# Patient Record
Sex: Female | Born: 1965 | Race: Black or African American | Hispanic: No | Marital: Married | Smoking: Never smoker
Health system: Southern US, Community
[De-identification: ages and names within clinical notes are randomized; demographics above are authoritative.]

## PROBLEM LIST (undated history)

## (undated) DIAGNOSIS — I10 Essential (primary) hypertension: Secondary | ICD-10-CM

## (undated) HISTORY — PX: ABDOMINAL HYSTERECTOMY: SHX81

---

## 2008-04-22 HISTORY — PX: BREAST CYST ASPIRATION: SHX578

## 2015-10-30 DIAGNOSIS — M2010 Hallux valgus (acquired), unspecified foot: Secondary | ICD-10-CM | POA: Insufficient documentation

## 2016-08-31 ENCOUNTER — Ambulatory Visit
Admission: EM | Admit: 2016-08-31 | Discharge: 2016-08-31 | Disposition: A | Discharge status: Home / Self Care | Payer: BC Managed Care – PPO | Attending: Family Medicine | Admitting: Family Medicine

## 2016-08-31 DIAGNOSIS — M545 Low back pain, unspecified: Secondary | ICD-10-CM

## 2016-08-31 HISTORY — DX: Essential (primary) hypertension: I10

## 2016-08-31 MED ORDER — MELOXICAM 7.5 MG PO TABS: 7.5000 mg | ORAL_TABLET | Freq: Every day | ORAL | 0 refills | Status: DC

## 2016-08-31 MED ORDER — CYCLOBENZAPRINE HCL 10 MG PO TABS: 10.0000 mg | ORAL_TABLET | Freq: Two times a day (BID) | ORAL | 0 refills | Status: DC | PRN

## 2016-08-31 NOTE — ED Provider Notes (Signed)
[facility] ____________________________________________  Time seen: Approximately 8:44 AM  I have reviewed the triage vital signs and the nursing notes.   HISTORY  Chief Complaint Back Pain   HPI Sharon Escobar is a 51 y.o. female  presenting for evaluation of left lower back pain has been present for the last 2 days. 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 Patient states that she and 2 other people or assisting the patient to use the restroom and states this process she was having to help push and strain her low back. Patient reports she then had some low back pain after this. 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 Denies fall or direct injury. Patient states the pain is primarily with movements. Patient states standing or sitting still helps with pain. Reports she has been taking over-the-counter ibuprofen with minimal improvement. Denies pain radiation, paresthesias, decreased range of motion. Denies other alleviating factors or attempts. Denies chronic back issues. Denies any urinary or bowel retention or incontinence.  Denies chest pain, shortness of breath, abdominal pain, dysuria, extremity pain, extremity swelling or rash. Denies recent sickness. Denies recent antibiotic use.   No LMP recorded. Patient has had a hysterectomy.   Past Medical History:  Diagnosis Date  . Hypertension     There are no active problems to display for this patient.   Past Surgical History:  Procedure Laterality Date  . ABDOMINAL HYSTERECTOMY    . CESAREAN SECTION       No current facility-administered medications for this encounter.   Current Outpatient Prescriptions:  .  metoprolol tartrate (LOPRESSOR) 25 MG tablet, Take 25 mg by mouth 2 (two) times daily., Disp: , Rfl:  .  verapamil (CALAN) 120 MG tablet, Take 240 mg by mouth 3 (three) times daily., Disp: , Rfl:  .  cyclobenzaprine (FLEXERIL) 10 MG tablet, Take 1 tablet (10 mg total)  by mouth 2 (two) times daily as needed for muscle spasms. Do not drive as can cause drowsiness., Disp: 15 tablet, Rfl: 0 .  meloxicam (MOBIC) 7.5 MG tablet, Take 1 tablet (7.5 mg total) by mouth daily., Disp: 10 tablet, Rfl: 0  Allergies Hydralazine; Influenza vaccines; Lisinopril; and Norvasc [amlodipine besylate]  History reviewed. No pertinent family history.  Social History Social History  Substance Use Topics  . Smoking status: Never Smoker  . Smokeless tobacco: Never Used  . Alcohol use No    Review of Systems Constitutional: No fever/chills Cardiovascular: Denies chest pain. Respiratory: Denies shortness of breath. Gastrointestinal: No abdominal pain.  No nausea, no vomiting.  No diarrhea.  No constipation. Genitourinary: Negative for dysuria. Musculoskeletal: Positive for back pain. Skin: Negative for rash.   ____________________________________________   PHYSICAL EXAM:  VITAL SIGNS: ED Triage Vitals  Enc Vitals Group     BP 08/31/16 0826 (!) 164/102     Pulse Rate 08/31/16 0826 67     Resp 08/31/16 0826 18     Temp 08/31/16 0826 98 F (36.7 C)     Temp Source 08/31/16 0826 Oral     SpO2 08/31/16 0826 100 %     Weight 08/31/16 0827 156 lb (70.8 kg)     Height 08/31/16 0827 5\' 5"  (1.651 m)     Head Circumference --      Peak Flow --      Pain Score 08/31/16 0827 8     Pain Loc --      Pain Edu? --  Excl. in GC? --     Constitutional: Alert and oriented. Well appearing and in no acute distress. Cardiovascular: Normal rate, regular rhythm. Grossly normal heart sounds.  Good peripheral circulation. Respiratory: Normal respiratory effort without tachypnea nor retractions. Breath sounds are clear and equal bilaterally. No wheezes, rales, rhonchi. Gastrointestinal: Soft and nontender. No distention.  No CVA tenderness. Musculoskeletal:  Nontender with normal range of motion in all extremities. No midline cervical, thoracic or lumbar tenderness to palpation.  Bilateral pedal pulses equal and easily palpated.      Right lower leg:  No tenderness or edema.      Left lower leg:  No tenderness or edema.  Except: Left lower paralumbar minimal to mild tenderness to palpation, pain increases with lumbar flexion and extension, no pain with lumbar rotation, mild pain with over head and lateral stretching, bilateral straight leg test negative, no saddle anesthesia, changes positions quickly in room, steady gait, no pain with standing bilateral leg lifts, bilateral plantar flexion and dorsiflexion strong and equal..  Neurologic:  Normal speech and language. No gross focal neurologic deficits are appreciated. Speech is normal. No gait instability.  Skin:  Skin is warm, dry  Psychiatric: Mood and affect are normal. Speech and behavior are normal. Patient exhibits appropriate insight and judgment   ___________________________________________   LABS (all labs ordered are listed, but only abnormal results are displayed)  Labs Reviewed - No data to display   PROCEDURES Procedures   ________________________________________   INITIAL IMPRESSION / ASSESSMENT AND PLAN / ED COURSE  Pertinent labs & imaging results that were available during my care of the patient were reviewed by me and considered in my medical decision making (see chart for details).  Well-appearing patient. No acute distress. Denies direct trauma or fall. Discussed in detail with patient suspect strain injury.discussed with patient as no trauma or midline tenderness, will defer xray, patient agreed. Encouraged supportive care, rest, ice, stretching avoidance of strenuous activity. Denies Worker's Compensation injury. Will start patient on oral daily Mobic and when necessary Flexeril. Discussed indication, risks and benefits of medications with patient.  Discussed follow up with Primary care physician this week. Discussed follow up and return parameters including no resolution or any worsening  concerns. Patient verbalized understanding and agreed to plan.   ____________________________________________   FINAL CLINICAL IMPRESSION(S) / ED DIAGNOSES  Final diagnoses:  Acute left-sided low back pain without sciatica     Discharge Medication List as of 08/31/2016  8:45 AM    START taking these medications   Details  cyclobenzaprine (FLEXERIL) 10 MG tablet Take 1 tablet (10 mg total) by mouth 2 (two) times daily as needed for muscle spasms. Do not drive as can cause drowsiness., Starting Sat 08/31/2016, Normal    meloxicam (MOBIC) 7.5 MG tablet Take 1 tablet (7.5 mg total) by mouth daily., Starting Sat 08/31/2016, Normal        Note: This dictation was prepared with Dragon dictation along with smaller phrase technology. Any transcriptional errors that result from this process are unintentional.         Renford DillsMiller, Wofford Stratton, NP 08/31/16 1117

## 2016-08-31 NOTE — Discharge Instructions (Signed)
Take medication as prescribed. Rest. Drink plenty of fluids. Avoid strenuous activity. ° °Follow up with your primary care physician this week as needed. Return to Urgent care for new or worsening concerns.  ° °

## 2016-08-31 NOTE — ED Triage Notes (Signed)
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

## 2016-12-06 ENCOUNTER — Ambulatory Visit
Admission: EM | Admit: 2016-12-06 | Discharge: 2016-12-06 | Disposition: A | Discharge status: Home / Self Care | Payer: PRIVATE HEALTH INSURANCE | Attending: Family Medicine | Admitting: Family Medicine

## 2016-12-06 ENCOUNTER — Encounter: Payer: Self-pay | Admitting: *Deleted

## 2016-12-06 DIAGNOSIS — L02411 Cutaneous abscess of right axilla: Secondary | ICD-10-CM

## 2016-12-06 MED ORDER — MUPIROCIN 2 % EX OINT: 1.0000 "application " | TOPICAL_OINTMENT | Freq: Two times a day (BID) | CUTANEOUS | 0 refills | Status: DC

## 2016-12-06 MED ORDER — SULFAMETHOXAZOLE-TRIMETHOPRIM 800-160 MG PO TABS: 1.0000 | ORAL_TABLET | Freq: Two times a day (BID) | ORAL | 0 refills | Status: DC

## 2016-12-06 MED ORDER — HYDROCODONE-ACETAMINOPHEN 5-325 MG PO TABS: 1.0000 | ORAL_TABLET | Freq: Three times a day (TID) | ORAL | 0 refills | Status: DC | PRN

## 2016-12-06 NOTE — ED Triage Notes (Signed)
Right axillary abcess x1 week. Saw her PCP yesterday and an I&D was done but pt feels it wasn't effective.

## 2016-12-06 NOTE — ED Provider Notes (Signed)
[facility]    CSN: [id number] Arrival date & time: 12/06/16  1511     History   Chief Complaint Chief Complaint  Patient presents with  . Abscess    HPI Sharon Escobar is a 51 y.o. female.   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 She states about 6-7 days ago she started noticing abscess under her right arm. She tried several things to help did not get better. According to her she went [facility] was seen by the PA there. Several attempts were made to open abscess unsuccessfully and she states she has the lacerated marks under her right arm to show it. She was told that the problem going get worse and she'll need to be reevaluated. She was also placed on Keflex. Was not the right thing to do in the abscess was never actually opened up though she's has some drainage coming from the area. The abscess is gotten bigger swelling is gotten worse since she came here to be seen and evaluated and to get second opinion about the abscess. She states that she called the [place] urgent care they would not tell her the provider who is going to be there today.   The history is provided by the patient. No language interpreter was used.  Abscess  Location:  Shoulder/arm Shoulder/arm abscess location:  R axilla Abscess quality: draining, fluctuance, induration, painful, redness and warmth   Red streaking: no   Duration:  7 days Progression:  Worsening Pain details:    Quality:  Pressure and throbbing   Severity:  Severe   Timing:  Constant   Progression:  Worsening Chronicity:  New Relieved by:  Nothing Ineffective treatments: Attempts made yesterday to open abscess. Risk factors: no family hx of MRSA, no hx of MRSA and no prior abscess     Past Medical History:  Diagnosis Date  . Hypertension     There are no active problems to display for this patient.   Past Surgical History:  Procedure Laterality  Date  . ABDOMINAL HYSTERECTOMY    . CESAREAN SECTION      OB History    No data available       Home Medications    Prior to Admission medications   Medication Sig Start Date End Date Taking? Authorizing Provider  cyclobenzaprine (FLEXERIL) 10 MG tablet Take 1 tablet (10 mg total) by mouth 2 (two) times daily as needed for muscle spasms. Do not drive as can cause drowsiness. 08/31/16  Yes Renford Dills, NP  meloxicam (MOBIC) 7.5 MG tablet Take 1 tablet (7.5 mg total) by mouth daily. 08/31/16  Yes Renford Dills, NP  HYDROcodone-acetaminophen (NORCO) 5-325 MG tablet Take 1 tablet by mouth every 8 (eight) hours as needed for moderate pain or severe pain. 12/06/16   Hassan Rowan, MD  metoprolol tartrate (LOPRESSOR) 25 MG tablet Take 25 mg by mouth 2 (two) times daily.    [provider]  mupirocin ointment (BACTROBAN) 2 % Apply 1 application topically 2 (two) times daily. 12/06/16   Hassan Rowan, MD  sulfamethoxazole-trimethoprim (BACTRIM DS,SEPTRA DS) 800-160 MG tablet Take 1 tablet by mouth 2 (two) times daily. 12/06/16   Hassan Rowan, MD  verapamil (CALAN) 120 MG tablet Take 240 mg by mouth 3 (three) times daily.    [provider]    Family History History reviewed. No pertinent family history.  Social History Social History  Substance  Use Topics  . Smoking status: Never Smoker  . Smokeless tobacco: Never Used  . Alcohol use No     Allergies   Hydralazine; Influenza vaccines; Lisinopril; and Norvasc [amlodipine besylate]   Review of Systems Review of Systems  Musculoskeletal: Positive for myalgias.  Skin: Positive for wound.  All other systems reviewed and are negative.    Physical Exam Triage Vital Signs ED Triage Vitals  Enc Vitals Group     BP 12/06/16 1533 (!) 147/107     Pulse Rate 12/06/16 1533 75     Resp 12/06/16 1533 16     Temp 12/06/16 1533 97.9 F (36.6 C)     Temp Source 12/06/16 1533 Oral     SpO2 12/06/16 1533 100 %     Weight  12/06/16 1535 157 lb (71.2 kg)     Height 12/06/16 1535 5\' 4"  (1.626 m)     Head Circumference --      Peak Flow --      Pain Score 12/06/16 1536 8     Pain Loc --      Pain Edu? --      Excl. in GC? --    No data found.   Updated Vital Signs BP (!) 147/107 (BP Location: Left Arm)   Pulse 75   Temp 97.9 F (36.6 C) (Oral)   Resp 16   Ht 5\' 4"  (1.626 m)   Wt 157 lb (71.2 kg)   SpO2 100%   BMI 26.95 kg/m   Visual Acuity Right Eye Distance:   Left Eye Distance:   Bilateral Distance:    Right Eye Near:   Left Eye Near:    Bilateral Near:     Physical Exam  Constitutional: She is oriented to person, place, and time. She appears well-developed and well-nourished.  HENT:  Head: Normocephalic and atraumatic.  Right Ear: External ear normal.  Left Ear: External ear normal.  Eyes: Pupils are equal, round, and reactive to light. EOM are normal.  Neck: Normal range of motion. Neck supple.  Pulmonary/Chest: Effort normal.  Musculoskeletal: Normal range of motion.  Neurological: She is alert and oriented to person, place, and time.  Skin: Skin is warm. Rash noted. There is erythema.  Psychiatric: She has a normal mood and affect.  Vitals reviewed.    UC Treatments / Results  Labs (all labs ordered are listed, but only abnormal results are displayed) Labs Reviewed  AEROBIC/ANAEROBIC CULTURE (SURGICAL/DEEP WOUND)    EKG  EKG Interpretation None       Radiology No results found.  Procedures .Marland KitchenIncision and Drainage Date/Time: 12/06/2016 5:40 PM Performed by: Hassan Rowan Authorized by: Hassan Rowan   Consent:    Consent obtained:  Verbal   Consent given by:  Patient   Risks discussed:  Incomplete drainage and infection   Alternatives discussed:  No treatment Location:    Type:  Abscess   Location:  Upper extremity   Upper extremity location:  Arm   Arm location:  R upper arm Pre-procedure details:    Skin preparation:  Betadine Anesthesia (see MAR for  exact dosages):    Anesthesia method:  Local infiltration   Local anesthetic:  Lidocaine 1% w/o epi Procedure type:    Complexity:  Simple Procedure details:    Needle aspiration: no     Incision types:  Stab incision and single straight   Incision depth:  Dermal   Scalpel blade:  11   Wound management:  Probed and deloculated (  irrigated w/lidocaine)   Drainage:  Purulent   Drainage amount:  Scant   Wound treatment:  Wound left open and drain placed   Packing materials:  1/2 in gauze Post-procedure details:    Patient tolerance of procedure:  Tolerated with difficulty Comments:     Patient type procedure packing placed   (including critical care time)  Medications Ordered in UC Medications - No data to display   Initial Impression / Assessment and Plan / UC Course  I have reviewed the triage vital signs and the nursing notes.  Pertinent labs & imaging results that were available during my care of the patient were reviewed by me and considered in my medical decision making (see chart for details).   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 Recommend we stop the Keflex will place on Septra DS 1 tablet twice a day and Bactroban ointment twice a day. Will give her Norco 15 tablets 1 tablet 3 times a day.  Final Clinical Impressions(s) / UC Diagnoses   Final diagnoses:  Abscess of axilla, right    New Prescriptions Discharge Medication List as of 12/06/2016  4:43 PM    START taking these medications   Details  HYDROcodone-acetaminophen (NORCO) 5-325 MG tablet Take 1 tablet by mouth every 8 (eight) hours as needed for moderate pain or severe pain., Starting Fri 12/06/2016, Normal    mupirocin ointment (BACTROBAN) 2 % Apply 1 application topically 2 (two) times daily., Starting Fri 12/06/2016, Normal      sulfamethoxazole-trimethoprim (BACTRIM DS,SEPTRA DS) 800-160 MG tablet Take 1 tablet by mouth 2 (two) times daily., Starting Fri 12/06/2016, Normal       Note: This dictation was prepared with Dragon dictation along with smaller phrase technology. Any transcriptional errors that result from this process are unintentional. Controlled Substance Prescriptions [place] Controlled Substance Registry consulted?Patient was given tramadol yesterday looked at the drug reporting site and other than the tramadol was given yesterday she has not had any narcotics until the first of the year. Will give her 15 Norco tablets instructions not to take with the tramadol tablets       Hassan Rowan, MD 12/06/16 1746

## 2016-12-12 LAB — AEROBIC/ANAEROBIC CULTURE W GRAM STAIN (SURGICAL/DEEP WOUND): Special Requests: NORMAL

## 2016-12-12 LAB — AEROBIC/ANAEROBIC CULTURE (SURGICAL/DEEP WOUND)

## 2017-04-25 ENCOUNTER — Encounter: Payer: Self-pay | Admitting: *Deleted

## 2017-04-25 ENCOUNTER — Ambulatory Visit
Admission: EM | Admit: 2017-04-25 | Discharge: 2017-04-25 | Disposition: A | Discharge status: Home / Self Care | Payer: BC Managed Care – PPO | Attending: Family Medicine | Admitting: Family Medicine

## 2017-04-25 ENCOUNTER — Other Ambulatory Visit: Payer: Self-pay

## 2017-04-25 DIAGNOSIS — I10 Essential (primary) hypertension: Secondary | ICD-10-CM

## 2017-04-25 MED ORDER — CLONIDINE HCL 0.1 MG PO TABS: 0.1000 mg | ORAL_TABLET | Freq: Two times a day (BID) | ORAL | 0 refills | Status: DC

## 2017-04-25 NOTE — ED Triage Notes (Signed)
Patient started having high blood pressure reading 3 days ago. Patient has not taken blood pressure medications for 7 months. Recently patient started having headaches and started checking her BP.

## 2017-04-25 NOTE — ED Provider Notes (Signed)
[facility]    CSN: [id number] Arrival date & time: 04/25/17  1831     History   Chief Complaint Chief Complaint  Patient presents with  . Blood Pressure Check    HPI Sharon Escobar is a 52 y.o. female.   52 yo female with a h/o hypertension previously on antihypertensive medication presents with 3 days of elevated blood pressures. Denies any chest pain, shortness of breath, vision changes, numbness/tingling. States however that she had been experiencing headaches recently. Patient states she has an appointment with her PCP in 4 days.    The history is provided by the patient.    Past Medical History:  Diagnosis Date  . Hypertension     There are no active problems to display for this patient.   Past Surgical History:  Procedure Laterality Date  . ABDOMINAL HYSTERECTOMY    . CESAREAN SECTION      OB History    No data available       Home Medications    Prior to Admission medications   Medication Sig Start Date End Date Taking? Authorizing Provider  cyclobenzaprine (FLEXERIL) 10 MG tablet Take 1 tablet (10 mg total) by mouth 2 (two) times daily as needed for muscle spasms. Do not drive as can cause drowsiness. 08/31/16  Yes Renford Dills, NP  linaclotide [facility]) 290 MCG CAPS capsule Take 290 mcg by mouth daily before breakfast.   Yes [provider]  meloxicam (MOBIC) 7.5 MG tablet Take 1 tablet (7.5 mg total) by mouth daily. 08/31/16  Yes Renford Dills, NP  cloNIDine (CATAPRES) 0.1 MG tablet Take 1 tablet (0.1 mg total) by mouth 2 (two) times daily. 04/25/17   Payton Mccallum, MD  HYDROcodone-acetaminophen (NORCO) 5-325 MG tablet Take 1 tablet by mouth every 8 (eight) hours as needed for moderate pain or severe pain. 12/06/16   Hassan Rowan, MD  metoprolol tartrate (LOPRESSOR) 25 MG tablet Take 25 mg by mouth 2 (two) times daily.    [provider]  mupirocin ointment (BACTROBAN) 2 % Apply 1 application topically 2 (two) times daily.  12/06/16   Hassan Rowan, MD  sulfamethoxazole-trimethoprim (BACTRIM DS,SEPTRA DS) 800-160 MG tablet Take 1 tablet by mouth 2 (two) times daily. 12/06/16   Hassan Rowan, MD  verapamil (CALAN) 120 MG tablet Take 240 mg by mouth 3 (three) times daily.    [provider]    Family History History reviewed. No pertinent family history.  Social History Social History   Tobacco Use  . Smoking status: Never Smoker  . Smokeless tobacco: Never Used  Substance Use Topics  . Alcohol use: No  . Drug use: No     Allergies   Hctz [hydrochlorothiazide]; Hydralazine; Influenza vaccines; Lisinopril; and Norvasc [amlodipine besylate]   Review of Systems Review of Systems   Physical Exam Triage Vital Signs ED Triage Vitals  Enc Vitals Group     BP 04/25/17 1855 (!) 167/109     Pulse Rate 04/25/17 1855 82     Resp 04/25/17 1855 16     Temp 04/25/17 1855 98.4 F (36.9 C)     Temp Source 04/25/17 1855 Oral     SpO2 04/25/17 1855 98 %     Weight 04/25/17 1856 160 lb (72.6 kg)     Height 04/25/17 1856 5\' 4"  (1.626 m)     Head Circumference --      Peak Flow --      Pain Score 04/25/17 1856 3  Pain Loc --      Pain Edu? --      Excl. in GC? --    No data found.  Updated Vital Signs BP (!) 167/109 (BP Location: Left Arm)   Pulse 82   Temp 98.4 F (36.9 C) (Oral)   Resp 16   Ht 5\' 4"  (1.626 m)   Wt 160 lb (72.6 kg)   SpO2 98%   BMI 27.46 kg/m   Visual Acuity Right Eye Distance:   Left Eye Distance:   Bilateral Distance:    Right Eye Near:   Left Eye Near:    Bilateral Near:     Physical Exam  Constitutional: She appears well-developed and well-nourished. No distress.  Cardiovascular: Normal rate, regular rhythm and normal heart sounds.  Pulmonary/Chest: Effort normal and breath sounds normal. No stridor. No respiratory distress. She has no wheezes. She has no rales.  Musculoskeletal: She exhibits no edema.  Skin: She is not diaphoretic.  Nursing note and  vitals reviewed.    UC Treatments / Results  Labs (all labs ordered are listed, but only abnormal results are displayed) Labs Reviewed - No data to display  EKG  EKG Interpretation None       Radiology No results found.  Procedures Procedures (including critical care time)  Medications Ordered in UC Medications - No data to display   Initial Impression / Assessment and Plan / UC Course  I have reviewed the triage vital signs and the nursing notes.  Pertinent labs & imaging results that were available during my care of the patient were reviewed by me and considered in my medical decision making (see chart for details).       Final Clinical Impressions(s) / UC Diagnoses   Final diagnoses:  Essential hypertension    ED Discharge Orders        Ordered    cloNIDine (CATAPRES) 0.1 MG tablet  2 times daily     04/25/17 1934     1. diagnosis reviewed with patient 2. rx as per orders above; reviewed possible side effects, interactions, risks and benefits; for short term treatment until patient follows up with PCP next week 3. Follow-up prn if symptoms worsen or don't improve  Controlled Substance Prescriptions [place] Controlled Substance Registry consulted? Not Applicable   Payton Mccallumonty, Jhase Creppel, MD 04/25/17 2054

## 2017-05-07 ENCOUNTER — Other Ambulatory Visit: Payer: Self-pay | Admitting: Family Medicine

## 2017-05-07 DIAGNOSIS — Z1239 Encounter for other screening for malignant neoplasm of breast: Secondary | ICD-10-CM

## 2017-12-11 ENCOUNTER — Other Ambulatory Visit (HOSPITAL_COMMUNITY): Payer: Self-pay | Admitting: Orthopaedic Surgery

## 2017-12-11 ENCOUNTER — Other Ambulatory Visit: Payer: Self-pay | Admitting: Orthopaedic Surgery

## 2017-12-11 DIAGNOSIS — M545 Low back pain: Secondary | ICD-10-CM

## 2017-12-24 ENCOUNTER — Ambulatory Visit
Admission: RE | Admit: 2017-12-24 | Discharge: 2017-12-24 | Disposition: A | Discharge status: Home / Self Care | Payer: BC Managed Care – PPO | Source: Ambulatory Visit | Attending: Orthopaedic Surgery | Admitting: Orthopaedic Surgery

## 2017-12-24 ENCOUNTER — Ambulatory Visit
Admission: RE | Admit: 2017-12-24 | Discharge: 2017-12-24 | Disposition: A | Discharge status: Home / Self Care | Payer: BC Managed Care – PPO | Source: Ambulatory Visit | Attending: Family Medicine | Admitting: Family Medicine

## 2017-12-24 DIAGNOSIS — M545 Low back pain: Secondary | ICD-10-CM | POA: Insufficient documentation

## 2017-12-24 DIAGNOSIS — Z1239 Encounter for other screening for malignant neoplasm of breast: Secondary | ICD-10-CM

## 2017-12-24 DIAGNOSIS — Z1231 Encounter for screening mammogram for malignant neoplasm of breast: Secondary | ICD-10-CM | POA: Diagnosis not present

## 2017-12-24 DIAGNOSIS — M47816 Spondylosis without myelopathy or radiculopathy, lumbar region: Secondary | ICD-10-CM | POA: Diagnosis not present

## 2018-01-06 ENCOUNTER — Inpatient Hospital Stay
Admission: RE | Admit: 2018-01-06 | Discharge: 2018-01-06 | Disposition: A | Discharge status: Home / Self Care | Payer: Self-pay | Source: Ambulatory Visit | Attending: *Deleted | Admitting: *Deleted

## 2018-01-06 ENCOUNTER — Other Ambulatory Visit: Payer: Self-pay | Admitting: *Deleted

## 2018-01-06 DIAGNOSIS — Z9289 Personal history of other medical treatment: Secondary | ICD-10-CM

## 2018-10-16 ENCOUNTER — Other Ambulatory Visit: Payer: Self-pay | Admitting: Rehabilitation

## 2018-10-16 ENCOUNTER — Other Ambulatory Visit: Payer: Self-pay

## 2018-10-16 DIAGNOSIS — M5412 Radiculopathy, cervical region: Secondary | ICD-10-CM

## 2018-11-03 ENCOUNTER — Ambulatory Visit
Admission: RE | Admit: 2018-11-03 | Discharge: 2018-11-03 | Disposition: A | Discharge status: Home / Self Care | Payer: BC Managed Care – PPO | Source: Ambulatory Visit | Attending: Rehabilitation | Admitting: Rehabilitation

## 2018-11-03 ENCOUNTER — Other Ambulatory Visit: Payer: Self-pay

## 2018-11-03 DIAGNOSIS — M5412 Radiculopathy, cervical region: Secondary | ICD-10-CM | POA: Diagnosis not present

## 2019-01-16 ENCOUNTER — Other Ambulatory Visit: Payer: Self-pay

## 2019-01-16 ENCOUNTER — Emergency Department
Admission: EM | Admit: 2019-01-16 | Discharge: 2019-01-16 | Disposition: A | Discharge status: Home / Self Care | Payer: BC Managed Care – PPO | Attending: Emergency Medicine | Admitting: Emergency Medicine

## 2019-01-16 ENCOUNTER — Encounter: Payer: Self-pay | Admitting: Emergency Medicine

## 2019-01-16 DIAGNOSIS — R42 Dizziness and giddiness: Secondary | ICD-10-CM | POA: Diagnosis not present

## 2019-01-16 DIAGNOSIS — I10 Essential (primary) hypertension: Secondary | ICD-10-CM | POA: Insufficient documentation

## 2019-01-16 DIAGNOSIS — R531 Weakness: Secondary | ICD-10-CM

## 2019-01-16 LAB — COMPREHENSIVE METABOLIC PANEL
ALT: 14 U/L (ref 0–44)
AST: 22 U/L (ref 15–41)
Albumin: 5 g/dL (ref 3.5–5.0)
Alkaline Phosphatase: 53 U/L (ref 38–126)
Anion gap: 8 (ref 5–15)
BUN: 11 mg/dL (ref 6–20)
CO2: 27 mmol/L (ref 22–32)
Calcium: 10.2 mg/dL (ref 8.9–10.3)
Chloride: 107 mmol/L (ref 98–111)
Creatinine, Ser: 0.82 mg/dL (ref 0.44–1.00)
GFR calc Af Amer: >60 mL/min (ref >60)
GFR calc non Af Amer: >60 mL/min (ref >60)
Glucose, Bld: 98 mg/dL (ref 70–99)
Potassium: 3.3 mmol/L — ABNORMAL LOW (ref 3.5–5.1)
Sodium: 142 mmol/L (ref 135–145)
Total Bilirubin: 0.8 mg/dL (ref 0.3–1.2)
Total Protein: 8.4 g/dL — ABNORMAL HIGH (ref 6.5–8.1)

## 2019-01-16 LAB — URINALYSIS, COMPLETE (UACMP) WITH MICROSCOPIC
Bacteria, UA: NONE SEEN
Bilirubin Urine: NEGATIVE
Glucose, UA: NEGATIVE mg/dL
Hgb urine dipstick: NEGATIVE
Ketones, ur: NEGATIVE mg/dL
Leukocytes,Ua: NEGATIVE
Nitrite: NEGATIVE
Protein, ur: NEGATIVE mg/dL
Specific Gravity, Urine: 1.015 (ref 1.005–1.030)
pH: 5 (ref 5.0–8.0)

## 2019-01-16 LAB — CBC WITH DIFFERENTIAL/PLATELET
Abs Immature Granulocytes: 0.01 10*3/uL (ref 0.00–0.07)
Basophils Absolute: 0 10*3/uL (ref 0.0–0.1)
Basophils Relative: 1 %
Eosinophils Absolute: 0.1 10*3/uL (ref 0.0–0.5)
Eosinophils Relative: 2 %
HCT: 34.3 % — ABNORMAL LOW (ref 36.0–46.0)
Hemoglobin: 11.7 g/dL — ABNORMAL LOW (ref 12.0–15.0)
Immature Granulocytes: 0 %
Lymphocytes Relative: 36 %
Lymphs Abs: 1.4 10*3/uL (ref 0.7–4.0)
MCH: 31 pg (ref 26.0–34.0)
MCHC: 34.1 g/dL (ref 30.0–36.0)
MCV: 90.7 fL (ref 80.0–100.0)
Monocytes Absolute: 0.3 10*3/uL (ref 0.1–1.0)
Monocytes Relative: 9 %
Neutro Abs: 2 10*3/uL (ref 1.7–7.7)
Neutrophils Relative %: 52 %
Platelets: 212 10*3/uL (ref 150–400)
RBC: 3.78 MIL/uL — ABNORMAL LOW (ref 3.87–5.11)
RDW: 13 % (ref 11.5–15.5)
WBC: 3.9 10*3/uL — ABNORMAL LOW (ref 4.0–10.5)
nRBC: 0 % (ref 0.0–0.2)

## 2019-01-16 LAB — GLUCOSE, CAPILLARY: Glucose-Capillary: 89 mg/dL (ref 70–99)

## 2019-01-16 LAB — TROPONIN I (HIGH SENSITIVITY): Troponin I (High Sensitivity): <2 ng/L (ref <18)

## 2019-01-16 MED ORDER — SODIUM CHLORIDE 0.9 % IV SOLN
1000.0000 mL | Freq: Once | INTRAVENOUS | Status: AC
  Administered 2019-01-16: 16:00:00 1000 mL via INTRAVENOUS

## 2019-01-16 MED ORDER — KETOROLAC TROMETHAMINE 30 MG/ML IJ SOLN
30.0000 mg | Freq: Once | INTRAMUSCULAR | Status: AC
  Administered 2019-01-16: 30 mg via INTRAVENOUS
  Filled 2019-01-16: qty 1

## 2019-01-16 MED ORDER — PROMETHAZINE HCL 25 MG PO TABS
25.0000 mg | ORAL_TABLET | Freq: Once | ORAL | Status: AC
  Administered 2019-01-16: 17:00:00 25 mg via ORAL
  Filled 2019-01-16: qty 1

## 2019-01-16 MED ORDER — PROMETHAZINE HCL 25 MG PO TABS: 25.0000 mg | ORAL_TABLET | ORAL | 1 refills | Status: DC | PRN

## 2019-01-16 NOTE — ED Provider Notes (Signed)
[facility] Emergency Department Provider Note       Time seen: ----------------------------------------- 3:34 PM on 01/16/2019 -----------------------------------------   I have reviewed the triage vital signs and the nursing notes.  HISTORY   Chief Complaint Weakness and Dizziness   HPI Sharon Escobar is a 53 y.o. female with a history of hypertension, IBS who presents to the ED for weakness, dizziness and poor appetite.  Patient reports she has had the symptoms for the past 7 days, nothing makes it better.  She is only eaten crackers in the last week, she feels like someone is blowing air inside of her head.  She has had chills and diarrhea, denies other complaints.  Past Medical History:  Diagnosis Date  . Hypertension     There are no active problems to display for this patient.   Past Surgical History:  Procedure Laterality Date  . ABDOMINAL HYSTERECTOMY    . BREAST CYST ASPIRATION Left 2010   fna- neg  . CESAREAN SECTION      Allergies Hctz [hydrochlorothiazide], Hydralazine, Influenza vaccines, Lisinopril, and Norvasc [amlodipine besylate]  Social History Social History   Tobacco Use  . Smoking status: Never Smoker  . Smokeless tobacco: Never Used  Substance Use Topics  . Alcohol use: No  . Drug use: No   Review of Systems Constitutional: Negative for fever. Cardiovascular: Negative for chest pain. Respiratory: Negative for shortness of breath. Gastrointestinal: Negative for abdominal pain, positive for diarrhea Musculoskeletal: Negative for back pain. Skin: Negative for rash. Neurological: Positive for weakness, dizziness  All systems negative/normal/unremarkable except as stated in the HPI  ____________________________________________   PHYSICAL EXAM:  VITAL SIGNS: ED Triage Vitals  Enc Vitals Group     BP      Pulse      Resp      Temp      Temp src      SpO2      Weight      Height      Head Circumference       Peak Flow      Pain Score      Pain Loc      Pain Edu?      Excl. in [place]?    Constitutional: Alert and oriented. Well appearing and in no distress. Eyes: Conjunctivae are normal. Normal extraocular movements. ENT      Head: Normocephalic and atraumatic.      Nose: No congestion/rhinnorhea.      Mouth/Throat: Mucous membranes are moist.      Neck: No stridor. Cardiovascular: Normal rate, regular rhythm. No murmurs, rubs, or gallops. Respiratory: Normal respiratory effort without tachypnea nor retractions. Breath sounds are clear and equal bilaterally. No wheezes/rales/rhonchi. Gastrointestinal: Soft and nontender. Normal bowel sounds Musculoskeletal: Nontender with normal range of motion in extremities. No lower extremity tenderness nor edema. Neurologic:  Normal speech and language. No gross focal neurologic deficits are appreciated.  Skin:  Skin is warm, dry and intact. No rash noted. Psychiatric: Mood and affect are normal. Speech and behavior are normal.  ____________________________________________  EKG: Interpreted by me.  Sinus rhythm with a rate of 89 bpm, low voltage, normal axis, nonspecific T wave abnormalities, normal QT  ____________________________________________  ED COURSE:  As part of my medical decision making, I reviewed the following data within the [place] History obtained from family if available, nursing notes, old chart and ekg, as well as notes from prior ED visits. Patient presented for weakness and  dizziness, we will assess with labs and imaging as indicated at this time.   Procedures  Sharon Escobar was evaluated in Emergency Department on 01/16/2019 for the symptoms described in the history of present illness. She was evaluated in the context of the global COVID-19 pandemic, which necessitated consideration that the patient might be at risk for infection with the SARS-CoV-2 virus that causes COVID-19. Institutional protocols and algorithms  that pertain to the evaluation of patients at risk for COVID-19 are in a state of rapid change based on information released by regulatory bodies including the CDC and federal and state organizations. These policies and algorithms were followed during the patient's care in the ED.  ____________________________________________   LABS (pertinent positives/negatives)  Labs Reviewed  CBC WITH DIFFERENTIAL/PLATELET - Abnormal; Notable for the following components:      Result Value   WBC 3.9 (*)    RBC 3.78 (*)    Hemoglobin 11.7 (*)    HCT 34.3 (*)    All other components within normal limits  COMPREHENSIVE METABOLIC PANEL - Abnormal; Notable for the following components:   Potassium 3.3 (*)    Total Protein 8.4 (*)    All other components within normal limits  URINALYSIS, COMPLETE (UACMP) WITH MICROSCOPIC - Abnormal; Notable for the following components:   Color, Urine YELLOW (*)    APPearance CLEAR (*)    All other components within normal limits  GLUCOSE, CAPILLARY  CBG MONITORING, ED  TROPONIN I (HIGH SENSITIVITY)   ____________________________________________   DIFFERENTIAL DIAGNOSIS   Dehydration, electrolyte abnormality, medication side effect, depression  FINAL ASSESSMENT AND PLAN  Weakness, dizziness   Plan: The patient had presented for weakness and dizziness. Patient's labs did not reveal any acute process.  Her symptoms are likely multifactorial as she has recently taken herself off of multiple medications including Linzess, Percocet, baclofen, senna and perhaps others.  She was given fluids here and is cleared for outpatient follow-up.   Ulice Dash, MD    Note: This note was generated in part or whole with voice recognition software. Voice recognition is usually quite accurate but there are transcription errors that can and very often do occur. I apologize for any typographical errors that were not detected and corrected.     Emily Filbert,  MD 01/16/19 [phone]

## 2019-01-16 NOTE — ED Triage Notes (Signed)
Patient presents to the ED with weakness, dizziness, diarrhea, lack of appetite and chills x 1 week .  Patient denies any known covid19 exposure.  Patient is alert and oriented x 4.

## 2019-02-10 DIAGNOSIS — G8929 Other chronic pain: Secondary | ICD-10-CM | POA: Insufficient documentation

## 2019-02-10 DIAGNOSIS — M5412 Radiculopathy, cervical region: Secondary | ICD-10-CM | POA: Insufficient documentation

## 2019-02-10 DIAGNOSIS — D649 Anemia, unspecified: Secondary | ICD-10-CM | POA: Insufficient documentation

## 2019-02-17 DIAGNOSIS — R195 Other fecal abnormalities: Secondary | ICD-10-CM | POA: Insufficient documentation

## 2019-02-22 DIAGNOSIS — M4722 Other spondylosis with radiculopathy, cervical region: Secondary | ICD-10-CM | POA: Insufficient documentation

## 2019-06-22 ENCOUNTER — Other Ambulatory Visit: Payer: Self-pay | Admitting: Orthopaedic Surgery

## 2019-06-22 DIAGNOSIS — M5412 Radiculopathy, cervical region: Secondary | ICD-10-CM

## 2019-06-24 ENCOUNTER — Telehealth: Payer: Self-pay | Admitting: Nurse Practitioner

## 2019-06-24 NOTE — Telephone Encounter (Signed)
Phone call to patient to verify medication list and allergies for myelogram procedure. Pt aware she will not need to hold any medications for this procedure. Pt verbalized understanding. Pre and post procedure instructions reviewed with pt. °

## 2019-07-06 ENCOUNTER — Ambulatory Visit
Admission: RE | Admit: 2019-07-06 | Discharge: 2019-07-06 | Disposition: A | Discharge status: Home / Self Care | Payer: BC Managed Care – PPO | Source: Ambulatory Visit | Attending: Orthopaedic Surgery | Admitting: Orthopaedic Surgery

## 2019-07-06 DIAGNOSIS — M5412 Radiculopathy, cervical region: Secondary | ICD-10-CM

## 2019-07-06 MED ORDER — MEPERIDINE HCL 50 MG/ML IJ SOLN
50.0000 mg | Freq: Once | INTRAMUSCULAR | Status: AC
  Administered 2019-07-06: 50 mg via INTRAMUSCULAR

## 2019-07-06 MED ORDER — DIAZEPAM 5 MG PO TABS
10.0000 mg | ORAL_TABLET | Freq: Once | ORAL | Status: AC
  Administered 2019-07-06: 5 mg via ORAL

## 2019-07-06 MED ORDER — SODIUM CHLORIDE 0.9 % IV SOLN: Freq: Once | INTRAVENOUS | Status: AC

## 2019-07-06 MED ORDER — ONDANSETRON HCL 4 MG/2ML IJ SOLN
4.0000 mg | Freq: Once | INTRAMUSCULAR | Status: AC
  Administered 2019-07-06: 4 mg via INTRAMUSCULAR

## 2019-07-06 MED ORDER — DIAZEPAM 5 MG PO TABS
5.0000 mg | ORAL_TABLET | Freq: Once | ORAL | Status: AC
  Administered 2019-07-06: 5 mg via ORAL

## 2019-07-06 NOTE — Discharge Instructions (Signed)

## 2019-07-06 NOTE — Discharge Instr - Other Orders (Addendum)
1015: Pt reports cramping/ pain in bilateral legs from procedure. Dr. Charise Killian notified. See MAR for PRN medications given.   Pts cramping did not subside with Demerol and Zofran given per Dr. Charise Killian. Dr. Charise Killian was at bedside assessing pt. Pts pain 10/10 due to BLE cramping/muscle spasms. 5mg  of additional ativan was given. An IV was started, 500 ML of fluids given. Around 1115 cramping started to subside.

## 2019-07-07 ENCOUNTER — Other Ambulatory Visit: Payer: BC Managed Care – PPO

## 2020-01-05 ENCOUNTER — Ambulatory Visit (INDEPENDENT_AMBULATORY_CARE_PROVIDER_SITE_OTHER): Payer: BC Managed Care – PPO

## 2020-01-05 ENCOUNTER — Ambulatory Visit
Admission: EM | Admit: 2020-01-05 | Discharge: 2020-01-05 | Disposition: A | Discharge status: Home / Self Care | Payer: BC Managed Care – PPO | Attending: Physician Assistant | Admitting: Physician Assistant

## 2020-01-05 ENCOUNTER — Other Ambulatory Visit: Payer: Self-pay

## 2020-01-05 DIAGNOSIS — R52 Pain, unspecified: Secondary | ICD-10-CM

## 2020-01-05 DIAGNOSIS — M25552 Pain in left hip: Secondary | ICD-10-CM

## 2020-01-05 DIAGNOSIS — M545 Low back pain, unspecified: Secondary | ICD-10-CM

## 2020-01-05 DIAGNOSIS — G8929 Other chronic pain: Secondary | ICD-10-CM

## 2020-01-05 DIAGNOSIS — M5416 Radiculopathy, lumbar region: Secondary | ICD-10-CM

## 2020-01-05 NOTE — Discharge Instructions (Addendum)
Your hip x-ray was normal today.  You were buttocks pain is very likely due to your chronic back pain condition.  Continue following up with spine specialist for further work-up including MRI and further treatment.  Continue medications as prescribed by your spine specialist.  I have discussed several exercises with you today to help relieve the pain in the buttocks.  Try local massage as well as heat.  Try the piriformis stretch.  As discussed, you can get a lot of relief if you take a tennis ball or lacrosse ball and place it at the site of pain and then with your back against the wall roll out the muscle.  Consider physical therapy which can help her condition greatly.  Follow-up with [place] as needed.
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
## 2020-01-18 ENCOUNTER — Other Ambulatory Visit: Payer: Self-pay | Admitting: Orthopaedic Surgery

## 2020-01-18 DIAGNOSIS — M545 Low back pain, unspecified: Secondary | ICD-10-CM

## 2020-01-18 DIAGNOSIS — M47896 Other spondylosis, lumbar region: Secondary | ICD-10-CM

## 2020-02-04 ENCOUNTER — Ambulatory Visit: Admission: RE | Admit: 2020-02-04 | Payer: BC Managed Care – PPO | Source: Ambulatory Visit

## 2020-02-23 ENCOUNTER — Ambulatory Visit
Admission: RE | Admit: 2020-02-23 | Discharge: 2020-02-23 | Disposition: A | Discharge status: Home / Self Care | Payer: BC Managed Care – PPO | Source: Ambulatory Visit | Attending: Orthopaedic Surgery | Admitting: Orthopaedic Surgery

## 2020-02-23 ENCOUNTER — Other Ambulatory Visit: Payer: Self-pay

## 2020-02-23 DIAGNOSIS — M545 Low back pain, unspecified: Secondary | ICD-10-CM

## 2020-02-23 DIAGNOSIS — M47896 Other spondylosis, lumbar region: Secondary | ICD-10-CM | POA: Diagnosis present

## 2020-08-24 ENCOUNTER — Other Ambulatory Visit: Payer: Self-pay | Admitting: Orthopedic Surgery

## 2020-08-24 DIAGNOSIS — M47816 Spondylosis without myelopathy or radiculopathy, lumbar region: Secondary | ICD-10-CM

## 2020-09-10 ENCOUNTER — Other Ambulatory Visit: Payer: Self-pay

## 2020-09-10 ENCOUNTER — Ambulatory Visit
Admission: RE | Admit: 2020-09-10 | Discharge: 2020-09-10 | Disposition: A | Discharge status: Home / Self Care | Payer: BC Managed Care – PPO | Source: Ambulatory Visit | Attending: Orthopedic Surgery | Admitting: Orthopedic Surgery

## 2020-09-10 DIAGNOSIS — M47816 Spondylosis without myelopathy or radiculopathy, lumbar region: Secondary | ICD-10-CM

## 2020-12-11 ENCOUNTER — Other Ambulatory Visit: Payer: Self-pay

## 2020-12-11 ENCOUNTER — Ambulatory Visit
Admission: EM | Admit: 2020-12-11 | Discharge: 2020-12-11 | Disposition: A | Discharge status: Home / Self Care | Payer: BC Managed Care – PPO | Attending: Emergency Medicine | Admitting: Emergency Medicine

## 2020-12-11 DIAGNOSIS — L03032 Cellulitis of left toe: Secondary | ICD-10-CM | POA: Diagnosis not present

## 2020-12-11 MED ORDER — MUPIROCIN 2 % EX OINT: 1.0000 "application " | TOPICAL_OINTMENT | Freq: Two times a day (BID) | CUTANEOUS | 0 refills | Status: AC

## 2020-12-11 MED ORDER — CEPHALEXIN 500 MG PO CAPS: 1000.0000 mg | ORAL_CAPSULE | Freq: Two times a day (BID) | ORAL | 0 refills | Status: AC

## 2020-12-11 NOTE — ED Provider Notes (Signed)
HPI  SUBJECTIVE:  Sharon Escobar is a 55 y.o. female who presents with 3 days of erythema, pain, swelling, purulent drainage at the medial left second toe.  She got a pedicure 5 days ago, and thinks that they cut her nail too short.  No fevers, body aches, entire toe swelling.  She has been doing Hibiclens soaks and Neosporin without much improvement her symptoms.  Symptoms are worse with palpation, walking.  She has a past medical history of hypertension.  No history of MRSA, diabetes, neuropathy.  PMD: In [facility].   Past Medical History:  Diagnosis Date   Hypertension     Past Surgical History:  Procedure Laterality Date   ABDOMINAL HYSTERECTOMY     BREAST CYST ASPIRATION Left 2010   fna- neg   CESAREAN SECTION      Family History  Problem Relation Age of Onset   Breast cancer Mother 20    Social History   Tobacco Use   Smoking status: Never   Smokeless tobacco: Never  Vaping Use   Vaping Use: Never used  Substance Use Topics   Alcohol use: No   Drug use: No    No current facility-administered medications for this encounter.  Current Outpatient Medications:    cephALEXin (KEFLEX) 500 MG capsule, Take 2 capsules (1,000 mg total) by mouth 2 (two) times daily for 7 days., Disp: 28 capsule, Rfl: 0   ibuprofen (ADVIL) 600 MG tablet, Take by mouth., Disp: , Rfl:    linaclotide (LINZESS) 290 MCG CAPS capsule, Take 290 mcg by mouth daily before breakfast., Disp: , Rfl:    lubiprostone (AMITIZA) 24 MCG capsule, Take 24 mcg by mouth 2 (two) times daily with a meal., Disp: , Rfl:    mupirocin ointment (BACTROBAN) 2 %, Apply 1 application topically 2 (two) times daily., Disp: 22 g, Rfl: 0   pantoprazole (PROTONIX) 40 MG tablet, Take by mouth., Disp: , Rfl:    spironolactone (ALDACTONE) 25 MG tablet, Take 25 mg by mouth daily., Disp: , Rfl:    verapamil (CALAN) 120 MG tablet, Take 240 mg by mouth 3 (three) times daily., Disp: , Rfl:    escitalopram (LEXAPRO) 10 MG  tablet, Take 15 mg by mouth daily., Disp: , Rfl:    metoprolol tartrate (LOPRESSOR) 25 MG tablet, Take 25 mg by mouth 2 (two) times daily., Disp: , Rfl:   Allergies  Allergen Reactions   Hctz [Hydrochlorothiazide]     Pt states she isn't allergic to HCTZ   Hydralazine    Influenza Vaccines    Lisinopril    Norvasc [Amlodipine Besylate]      ROS  As noted in HPI.   Physical Exam  BP (!) 150/85 (BP Location: Left Arm)   Pulse 70   Temp 98.2 F (36.8 C) (Oral)   Resp 16   Ht 5\' 4"  (1.626 m)   Wt 72.6 kg   SpO2 98%   BMI 27.46 kg/m   Constitutional: Well developed, well nourished, no acute distress Eyes:  EOMI, conjunctiva normal bilaterally HENT: Normocephalic, atraumatic,mucus membranes moist Respiratory: Normal inspiratory effort Cardiovascular: Normal rate GI: nondistended skin: No rash, skin intact Musculoskeletal: Tender paronychia second left medial toe.  No evidence of felon.  No expressible purulent drainage.    Neurologic: Alert & oriented x 3, no focal neuro deficits Psychiatric: Speech and behavior appropriate   ED Course   Medications - No data to display  No orders of the defined types were placed in this  encounter.   No results found for this or any previous visit (from the past 24 hour(s)). No results found.  ED Clinical Impression  1. Paronychia of second toe of left foot      ED Assessment/Plan  Procedure note: Cleaned toe with alcohol twice.  Used ethyl chloride spray for topical anesthesia, made a single stab incision with a sterile 18-gauge needle with bloody return.  No purulence.  Nothing to culture.  Applied pressure, Band-Aids.  Patient tolerated procedure well.  Patient with a paronychia.  Will send home with Keflex and Bactroban ointment.  Continue Hibiclens soaks.  Wide shoes.  Breathable, cotton socks.  Follow-up with PMD as needed.   Discussed  MDM, treatment plan, and plan for follow-up with patient. . patient agrees with  plan.   Meds ordered this encounter  Medications   cephALEXin (KEFLEX) 500 MG capsule    Sig: Take 2 capsules (1,000 mg total) by mouth 2 (two) times daily for 7 days.    Dispense:  28 capsule    Refill:  0   mupirocin ointment (BACTROBAN) 2 %    Sig: Apply 1 application topically 2 (two) times daily.    Dispense:  22 g    Refill:  0      *This clinic note was created using Scientist, clinical (histocompatibility and immunogenetics). Therefore, there may be occasional mistakes despite careful proofreading.  ?    Domenick Gong, MD 12/11/20 1924

## 2020-12-11 NOTE — Discharge Instructions (Addendum)
Continue Hibiclens soaks, wear wide shoes with breathable cotton socks.  Finish the Keflex, even if you feel better.  Bactroban will take care of any possible MRSA infection.

## 2020-12-11 NOTE — ED Triage Notes (Signed)
Pt here with complaint of infection on left foot 2nd toe. Noticed Friday, has been keeping it covered with neosporin and bandaid

## 2021-12-20 IMAGING — MR MR LUMBAR SPINE W/O CM
4 of 5 series · 27 of 48 positions shown · non-contrast
Comparison: 02/23/2020

CLINICAL DATA: Low back pain radiating to the left leg

EXAM:
MRI LUMBAR SPINE WITHOUT CONTRAST
TECHNIQUE: Multiplanar, multisequence MR imaging of the lumbar spine was
performed. No intravenous contrast was administered.

[Series 3: T2 · sagittal · 4.0mm · 1.09mm/px · 6 of 17 slices shown (1 of 2)]
[im 1/17]
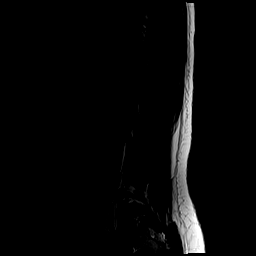
[im 4/17]
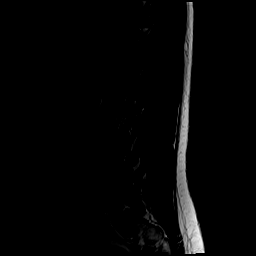
[im 7/17]
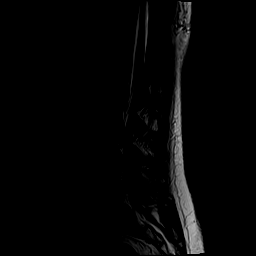
[im 10/17]
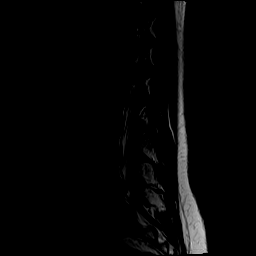
[im 13/17]
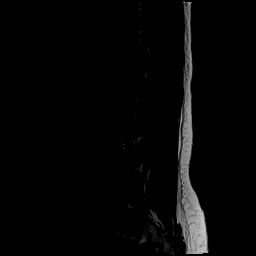
[im 17/17]
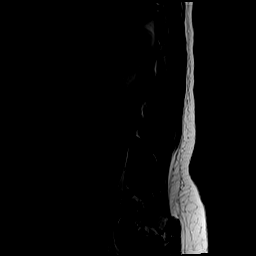

[Series 5: T1 · sagittal · 4.0mm · 1.09mm/px · 6 of 17 slices shown (1 of 2)]
[im 1/17]
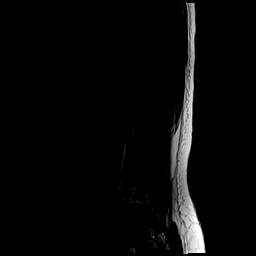
[im 4/17]
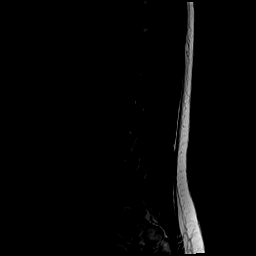
[im 7/17]
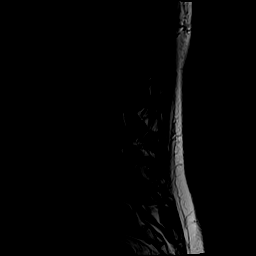
[im 10/17]
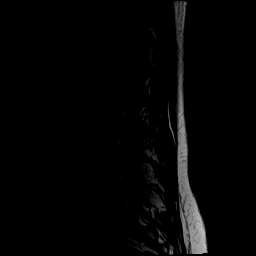
[im 13/17]
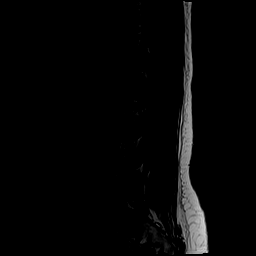
[im 17/17]
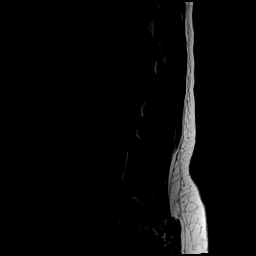

[Series 6: T2 · axial · 4.0mm · 0.39mm/px · z∈[-132,+82]mm · 9 of 42 slices shown (2 of 2)]
[im 1/42]
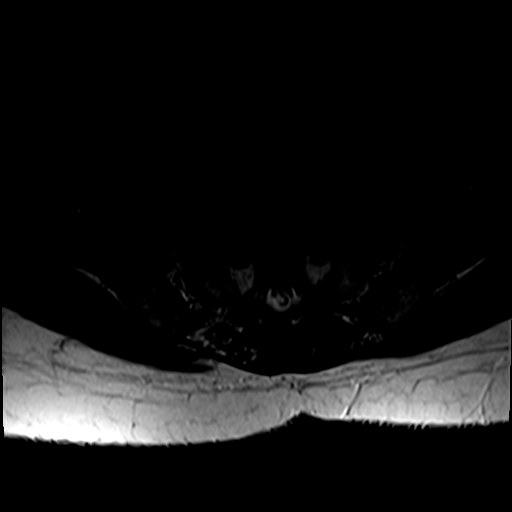
[im 6/42]
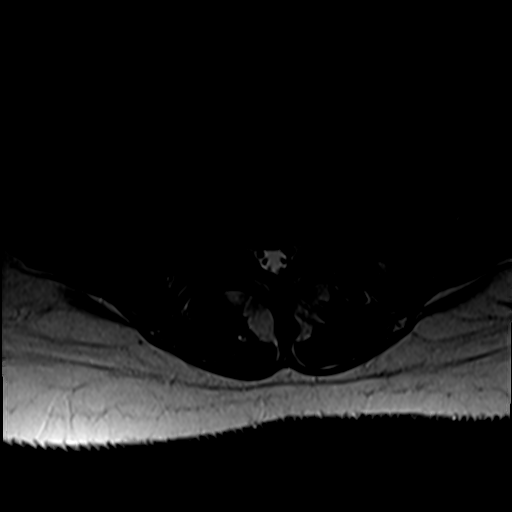
[im 12/42]
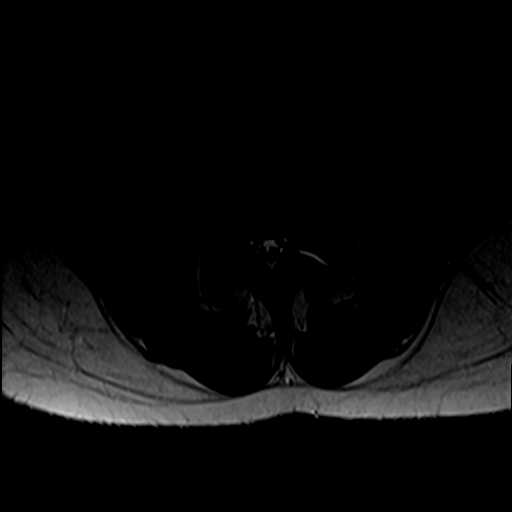
[im 18/42]
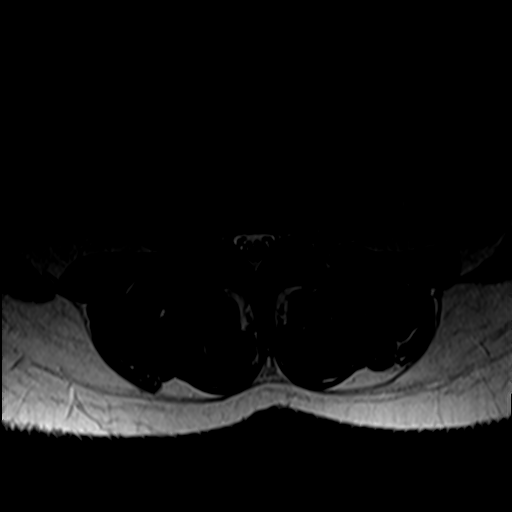
[im 21/42]
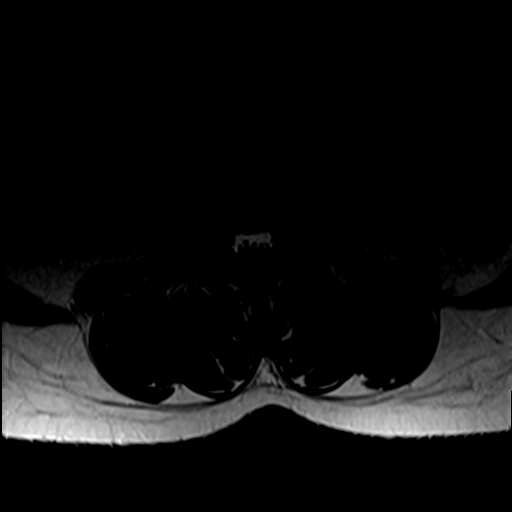
[im 24/42]
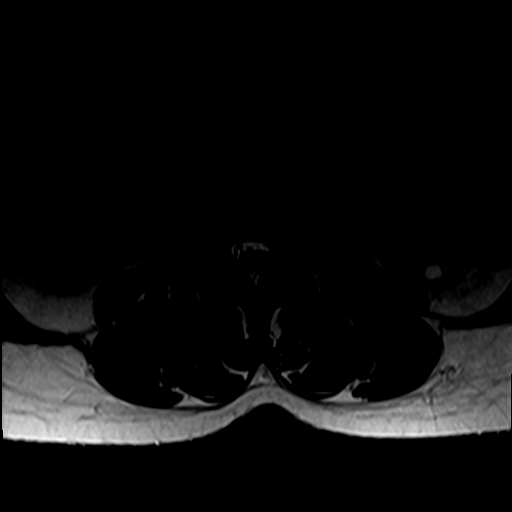
[im 30/42]
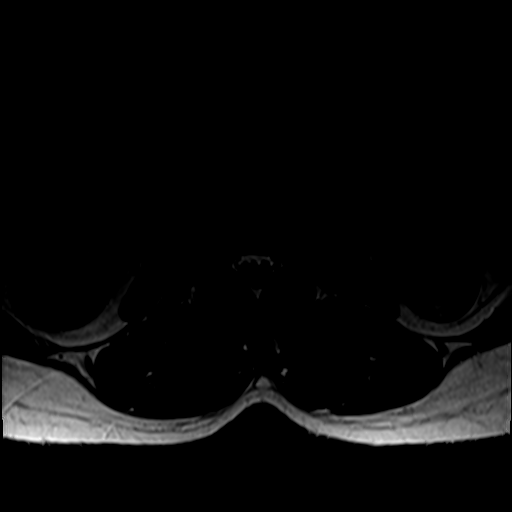
[im 36/42]
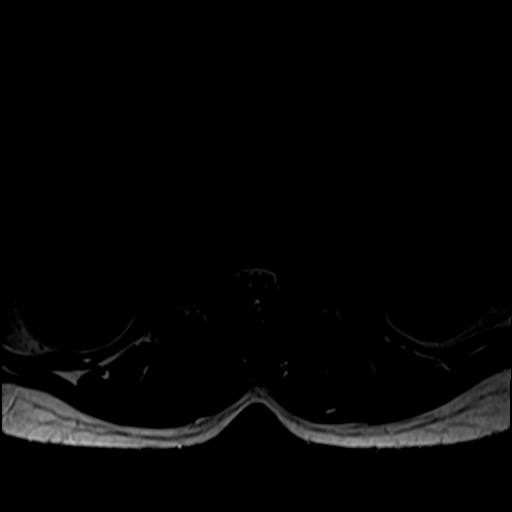
[im 42/42]
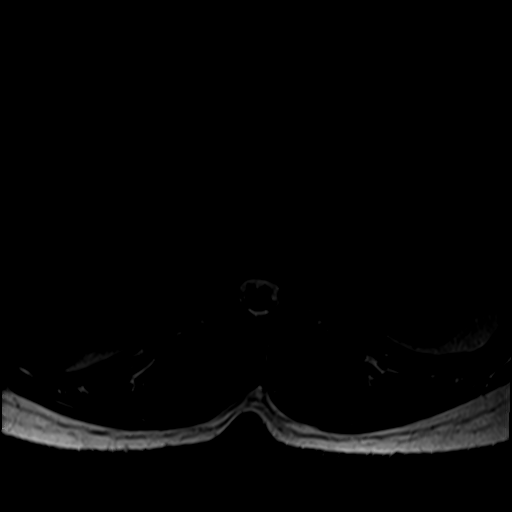

[Series 7: T1 · axial · 4.0mm · 0.39mm/px · z∈[-132,+53]mm · 6 of 42 slices shown (2 of 2)]
[im 1/42]
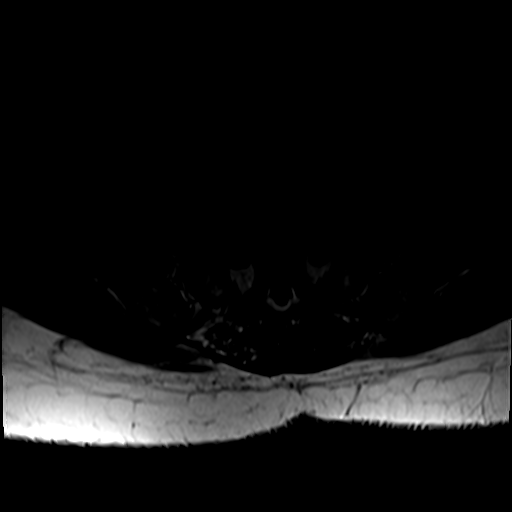
[im 6/42]
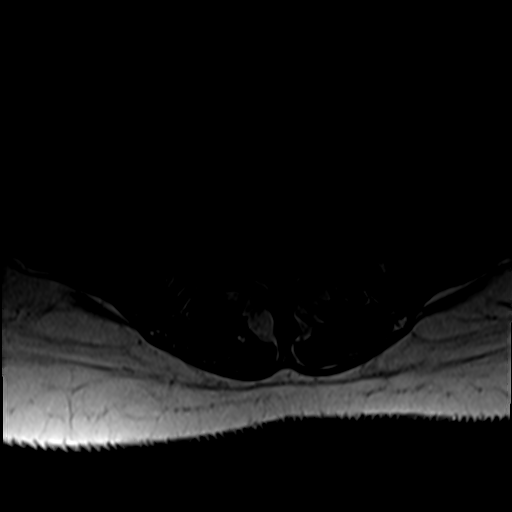
[im 12/42]
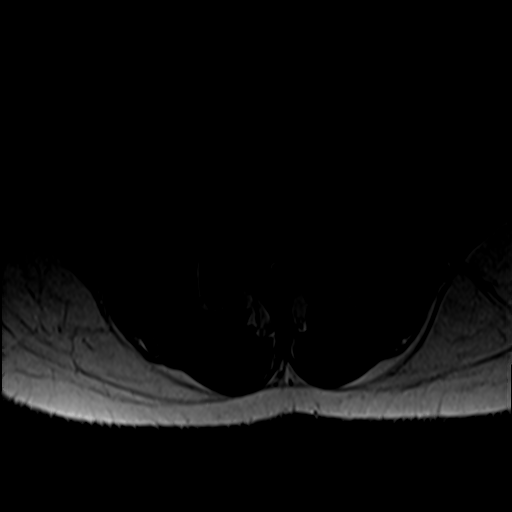
[im 18/42]
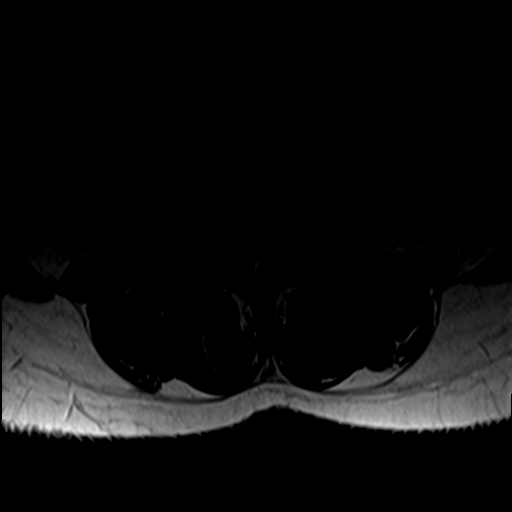
[im 21/42]
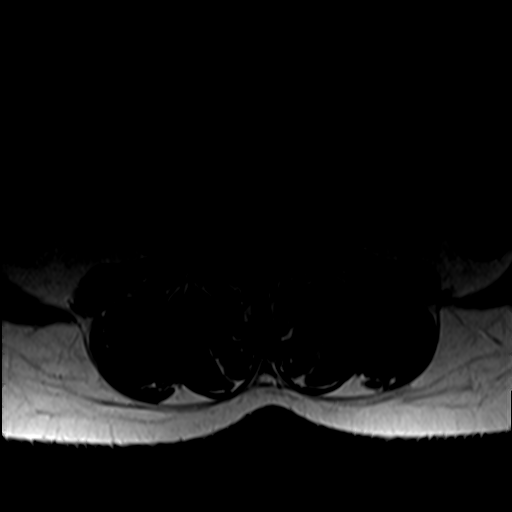
[im 36/42]
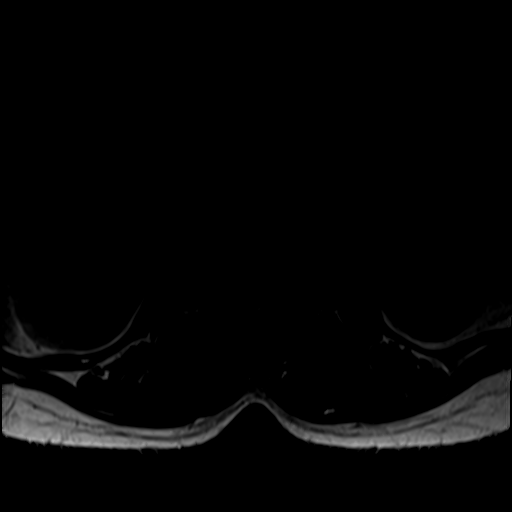

[27 of 48 positions shown; findings below may reference images not displayed]

FINDINGS: Segmentation:  5 lumbar type vertebral bodies.

Alignment:  Normal

Vertebrae:  Normal

Conus medullaris and cauda equina: Conus extends to the L1 level.
Conus and cauda equina appear normal.

Paraspinal and other soft tissues: Renal cysts as seen previously.
Gallbladder not visible on today's exam.

Disc levels:

No abnormality at L1-2 or above.

L2-3: Minimal bulging of the disc.  No stenosis.

L3-4: Minimal bulging of the disc. Mild facet degeneration with
facet and ligamentous hypertrophy. No compressive stenosis.

L4-5: Mild bulging of the disc. Bilateral facet osteoarthritis with
mild hypertrophic change. Mild stenosis of both lateral recesses and
neural foramina but without visible neural compression. The facet
arthritis could be symptomatic.

L5-S1: Minimal bulging of the disc. No stenosis. Bilateral facet
osteoarthritis which could be symptomatic.
IMPRESSION: No visible change since 02/23/2020. The dominant finding in this
case is that of facet osteoarthritis, most pronounced at the L4-5
level, but to a lesser degree at L3-4 and L5-S1. The facet arthritis
could be a cause of back pain or referred facet syndrome pain. There
are some very minimal disc bulges, but no disc herniation or
compressive stenosis of the canal or foramina.
